# Patient Record
Sex: Female | Born: 1940 | Race: White | Hispanic: No | Marital: Married
Health system: Midwestern US, Community
[De-identification: ages and names within clinical notes are randomized; demographics above are authoritative.]

## PROBLEM LIST (undated history)

## (undated) DIAGNOSIS — Z1211 Encounter for screening for malignant neoplasm of colon: Secondary | ICD-10-CM

---

## 2012-01-13 NOTE — Telephone Encounter (Signed)
01/13/12 Going through NOV 2012 recall list; Pt never sch cln; Called pt to set up ov prior to sch proc and she stated she was in the car and did not have her calendar with her and she has so many upcoming appts and vacation that she will have to call us back to sch; I voiced understanding; I am going to mail out final cln recall letter as a reminder to the pt to sch; [place]

## 2012-06-20 NOTE — Progress Notes (Signed)
Subjective:      Patient ID: Bailey Johnson is a 71 y.o. female.    HPI  Patient seen for colonoscopy recall. Last c-scope 06/2008 negative. Previous history of adenomatous polyp removed in 2005 and hyperplastic polyp removed 2007.   Father with colon cancer diagnosed age 41's. History of colon polyps also.   Patient denies rectal bleeding, change in bowel habit or abdominal pain. Has chronic diarrhea/loose stool.  Reports no anemia, unexplained weight loss.   No ugi complaints.     History:  family history includes Colon Cancer in her father; Colon Polyps in her father; and Uterine Cancer in her mother.  Past Medical History   Diagnosis Date   . Glucose intolerance (impaired glucose tolerance)    . Colitis    . Colon polyp    . Hypothyroidism      Past Surgical History   Procedure Laterality Date   . Hysterectomy     . Blepharoplasty     . Bladder repair     . Cataract removal     . Tubal ligation     . Cholecystectomy     . Breast surgery       [place] RIGHT BREAST   . Colonoscopy  06-17-2008     [place]   . Upper gastrointestinal endoscopy  1973     [place]      reports that she has never smoked. She does not have any smokeless tobacco history on file. She reports that she does not drink alcohol or use illicit drugs.    Allergies:  Allergies   Allergen Reactions   . Latex        Medications:  Current Outpatient Prescriptions on File Prior to Visit   Medication Sig Dispense Refill   . Levothyroxine Sodium (SYNTHROID PO) Take  by mouth.       . Multiple Vitamins-Minerals (MULTIVITAMIN PO) Take  by mouth.         No current facility-administered medications on file prior to visit.         Review of Systems   Constitutional: Negative for fever, appetite change, fatigue and unexpected weight change.   HENT: Negative for sore throat, trouble swallowing and voice change.    Eyes: Negative.    Respiratory: Negative for cough and shortness of breath.    Cardiovascular: Negative for chest pain, palpitations and leg  swelling.   Gastrointestinal: Positive for diarrhea. Negative for nausea, vomiting, abdominal pain, constipation and blood in stool.   Genitourinary: Negative for dysuria, hematuria and difficulty urinating.   Musculoskeletal: Positive for arthralgias. Negative for back pain.   Skin: Negative for color change, pallor and rash.   Neurological: Negative for tremors, seizures and weakness.   Psychiatric/Behavioral: Negative for confusion and sleep disturbance. The patient is not nervous/anxious.        Objective:   Physical Exam   Vitals reviewed.  Constitutional: She is oriented to person, place, and time. She appears well-developed and well-nourished. She is cooperative.   Cardiovascular: Normal rate and regular rhythm.    Pulmonary/Chest: Effort normal and breath sounds normal. No respiratory distress.   Abdominal: Soft. Normal appearance and bowel sounds are normal. She exhibits no distension and no mass. There is no hepatomegaly. There is no tenderness. There is no rebound and no guarding.   Neurological: She is alert and oriented to person, place, and time.   Skin: Skin is warm and dry.   Psychiatric: She has a normal mood and affect. Her  behavior is normal.       Assessment:      Screening colonoscopy  History of adenomatous colon polyp   Family history (father) with colon cancer / polyps      Plan:      Schedule colonoscopy with MAC anesthesia with Dr. Beckey Downing    Instruct on bowel prep.   Nothing to eat or drink after midnight the day of the exam.  Unable to drive for 24 hours after the procedure.   No aspirin or nonsteroidal anti-inflammatories for 5 days before procedure.    Risks, benefits and alternatives to colonoscopy were discussed.  Patient voices understanding of risk of perforation, bleeding and infection.  Time was allowed for questions which were answered to patient's satisfaction. Patient is agreeable to proceed.

## 2012-06-20 NOTE — Patient Instructions (Addendum)
 Schedule colonoscopy  Follow prep instructions provided for bowel prep.   Nothing to eat or drink after midnight.  You will not be able to drive for 24 hours after the procedure due to sedation. Bring a driver with you the day of the procedure.   No aspirin, ibuprofen, naproxen, fish oil or vitamin E for 5 days before procedure.     If polyps are removed during the procedure they will be sent to a pathologist for analysis. You may be given an appointment to discuss this report and check if you have had any problems related to your procedure. Final recommendations are based on the pathologist report.     Your diet before a colonoscopy bowel preparation is very important to ensure a successful colon exam. It is essential to consider certain changes to your diet three to four days prior to the procedure. There are certain foods that you can and cannot eat as part of your diet. Remember that your bowels need to be empty for the exam.    What foods are good to eat?  Cut down on heavy solid foods three to four days before the procedure and start introducing lighter meals to your diet. The following food suggestions are a good part of your diet before a colonoscopy bowel preparation.  Light meat that is easily digestible such as chicken (without the skin)   Potatoes without skin   Cheese   Eggs   A light meal of steamed white fish   Light clear soups    Foods and drinks to avoid  Avoid foods that contain too much fiber. Stay clear of dark colored beverages. They can stick to the walls of the digestive tract and make it difficult to differentiate from blood. Some of these foods are:  Red meat, rice, nuts and vegetables   Milk, other milk based fluids and cream   Most fruit and puddings   Whole grain pasta   Cereals, bran and seeds   Colored beverages, especially those that are red or purple in color   Red colored Jell-O   On the day before the colonoscopy, continue to drink plenty of clear fluids. It is important   to keep  yourself hydrated before the exam.     You will be given specific directions regarding restrictions to diet and bowel prep instructions including laxatives. Please read these instructions one week prior to your scheduled procedure to ensure that you are prepared.     Please follow all instructions as provided for cleansing the bowel. Failure to have an adequately prepped colon may cause you to have incomplete exam with further testing required.     [email or web address]

## 2019-01-23 LAB — HEMOGLOBIN A1C: Hemoglobin A1C, External: 5.9 % — ABNORMAL HIGH (ref 4.80–5.60)

## 2019-09-26 ENCOUNTER — Ambulatory Visit: Admit: 2019-09-26 | Discharge: 2019-09-26 | Payer: MEDICARE | Primary: Internal Medicine

## 2019-10-17 ENCOUNTER — Ambulatory Visit: Admit: 2019-10-17 | Discharge: 2019-10-17 | Payer: MEDICARE | Primary: Internal Medicine

## 2020-02-07 ENCOUNTER — Ambulatory Visit: Admit: 2020-02-07 | Payer: MEDICARE | Primary: Internal Medicine

## 2020-02-07 ENCOUNTER — Inpatient Hospital Stay: Payer: MEDICARE

## 2020-02-07 MED ORDER — PROPOFOL 200 MG/20ML IV EMUL
200 MG/20ML | INTRAVENOUS | Status: DC | PRN
Start: 2020-02-07 — End: 2020-02-07
  Administered 2020-02-07: 15:00:00 200 via INTRAVENOUS

## 2020-02-07 MED ORDER — SODIUM CHLORIDE 0.9 % IV SOLN
0.9 % | INTRAVENOUS | Status: DC
Start: 2020-02-07 — End: 2020-02-07
  Administered 2020-02-07: 14:00:00 via INTRAVENOUS

## 2020-02-07 MED ORDER — LIDOCAINE HCL 1 % IJ SOLN
1 % | INTRAMUSCULAR | Status: DC | PRN
Start: 2020-02-07 — End: 2020-02-07
  Administered 2020-02-07: 15:00:00 40 via INTRAVENOUS

## 2020-02-07 NOTE — Op Note (Signed)
Patient: Bailey Johnson Dob: 1940/08/20  Med Rec#: [id number] Acc#: [id number]   Primary Care Provider [facility], DO    Date of Procedure:  02/07/2020    Endoscopist: Farrel Gobble, MD    Referring Provider: Heywood Footman, DO    Operation Performed: Colonoscopy     Indications: Screening- hx of polyps    Anesthesia:  Sedation was administered by anesthesia who monitored the patient during the procedure.    I met with [name] prior to procedure. We discussed the procedure itself, and I have discussed the risks of endoscopy (including-- but not limited to-- pain, discomfort, bleeding potentially requiring second endoscopic procedure and/or blood transfusion, organ perforation requiring operative repair, damage to organs near the colon, infection, aspiration, cardiopulmonary/allergic reaction), benefits, indications to endoscopy. Additionally, we discussed options other than colonoscopy. The patient expressed understanding. All questions answered. The patient decided to proceed with the procedure.  Signed informed consent was placed on the chart.    Blood Loss: minimal    Withdrawal time: > 6 min  Bowel Prep: adequate     Complications: no immediate complications    DESCRIPTION OF PROCEDURE:     A time out was performed. After written informed consent was obtained, the patient was placed in the left lateral position.     The perianal area was inspected, and a digital rectal exam was performed. A rectal exam was performed: normal tone, no palpable lesions. At this point, a forward viewing Olympus colonoscope was inserted into the anus and carefully advanced to the cecum.  The cecum was identified by the ileocecal valve and the appendiceal orifice. The colonoscope was then slowly withdrawn with careful inspection of the mucosa in a linear and circumferential fashion. The scope was retroflexed in the rectum. Suction was utilized during the procedure to remove as much air as possible from the bowel. The  colonoscope was removed from the patient, and the procedure was terminated.  Findings are listed below.        Findings:     The mucosa appeared normal throughout the entire examined colon.    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     Retroflexion in the rectum was normal and revealed no further abnormalities.      Recommendations:  1. Repeat colonoscopy: not necessary due to age and lack of polyps      Findings and recommendations were discussed w/ the patient. A copy of the images was provided.    Carmell Austria, am scribing for and in the presence of Dr. Farrel Gobble, MD.  Electronically signed by Katherina Right, RN on 02/07/2020 at 9:30 AM    I personally performed the services described in this documentation as scribed by Kaylyn Layer, and it appears accurate and complete.     Farrel Gobble, MD  02/07/2020

## 2020-02-07 NOTE — Discharge Instructions (Signed)
Recommendations:  1. Repeat colonoscopy: not necessary due to age and lack of polyps          POST-OP ORDERS: ENDOSCOPY & COLONOSCOPY:    1. Rest today.  2. DO NOT eat or drink until wide awake; eat your usual diet today in moderate amount only.  3. DO NOT drive today.  4. Call physician if you have severe pain, vomiting, fever, rectal bleeding or black bowel movements.  5.  If a biopsy was taken or a polyp removed, you should expect to hear results in about 21 days.  If you have heard nothing from your physician by then, call the office for results.  6.  Discharge home when patient awake, vitals signs stable and tolerating liquids.

## 2020-02-07 NOTE — H&P (Signed)
Patient Name: Bailey Johnson  DOB: Apr 30, 1941  MRN: [id number]  DATE: 02/07/20    Allergies:   Allergies   Allergen Reactions   . Latex         ENDOSCOPY  History and Physical    Procedure:    []  Diagnostic Colonoscopy       [x]  Screening Colonoscopy  []  EGD      []  ERCP      []  EUS       []  Other    [x]  Previous office notes/History and Physical reviewed from the patients chart. Please see EMR for further details of HPI. I have examined the patient's status immediately prior to the procedure and:      Indications/HPI:       [x]  Screening              [x]  History of Polyps      [] Fhx of colon CA/polyps       Anesthesia:   [x]  MAC []  Moderate Sedation   []  General   []  None     ROS: 12 pt review of systems was negative unless stated above    Medications:   Prior to Admission medications    Medication Sig Start Date End Date Taking? Authorizing Provider   Levothyroxine Sodium (SYNTHROID PO) Take  by mouth.    Historical Provider, MD   Multiple Vitamins-Minerals (MULTIVITAMIN PO) Take  by mouth.    Historical Provider, MD       Past Medical History:  Past Medical History:   Diagnosis Date   . Colitis    . Colon polyp    . Eczema    . Glucose intolerance (impaired glucose tolerance)    . Hypothyroidism    . OA (osteoarthritis) of knee        Past Surgical History:  Past Surgical History:   Procedure Laterality Date   . BLADDER REPAIR     . BLEPHAROPLASTY     . BREAST LUMPECTOMY      right breast- benign   . BREAST SURGERY      CYST RIGHT BREAST   . CATARACT REMOVAL     . CHOLECYSTECTOMY     . COLONOSCOPY  06-17-2008    [place]   . FOOT SURGERY      right   . HYSTERECTOMY     . TUBAL LIGATION     . UPPER GASTROINTESTINAL ENDOSCOPY  1973    [place]       Social History:  Social History     Tobacco Use   . Smoking status: Never Smoker   Substance Use Topics   . Alcohol use: No   . Drug use: No       Vital Signs:   There were no vitals filed for this visit.     Physical  Exam:  Cardiac:  [x] WNL  [] Comments:  Pulmonary:  [x] WNL   [] Comments:  Neuro/Mental Status:  [x] WNL  [] Comments:  Abdominal:  [x] WNL    [] Comments:  Other:   [] WNL  [] Comments:    Informed Consent:  The risks and benefits of the procedure have been discussed with either the patient or if they cannot consent, their representative.    Assessment:  Patient examined and appropriate for planned sedation and procedure.     Plan:  Proceed with planned sedation and procedure as above.    Carmell Austria, am scribing for and in the presence of Dr. Farrel Gobble, MD.  Electronically signed by  Katherina Right, RN on 02/07/2020 at 9:29 AM    I personally performed the services described in this documentation as scribed by Kaylyn Layer, and it appears accurate and complete.     Farrel Gobble, MD  02/07/2020

## 2020-02-07 NOTE — Anesthesia Post-Procedure Evaluation (Signed)
Department of Anesthesiology  Postprocedure Note    Patient: Bailey Johnson  MRN: [id number]  Birthdate: 1940-09-05  Date of evaluation: 02/07/2020  Time:  11:02 AM     Procedure Summary     Date: 02/07/20 Room / Location: [facility] ENDO 02 / [facility]    Anesthesia Start: 1055 Anesthesia Stop:     Procedure: COLORECTAL CANCER SCREENING, NOT HIGH RISK (N/A Abdomen) Diagnosis: (FAM HX COLON CA)    Surgeons: [place], MD Responsible Provider: Nickolas Madrid, APRN - CRNA    Anesthesia Type: general, TIVA ASA Status: 2          Anesthesia Type: No value filed.    Aldrete Phase I:      Aldrete Phase II:      Last vitals: Reviewed and per EMR flowsheets.       Anesthesia Post Evaluation    Patient location during evaluation: bedside  Patient participation: complete - patient participated  Level of consciousness: sleepy but conscious  Pain score: 0  Airway patency: patent  Nausea & Vomiting: no nausea and no vomiting  Complications: no  Cardiovascular status: blood pressure returned to baseline  Respiratory status: acceptable, room air and spontaneous ventilation  Hydration status: euvolemic

## 2020-02-07 NOTE — Anesthesia Pre-Procedure Evaluation (Signed)
Department of Anesthesiology  Preprocedure Note       Name:  CHARRIE EIKEN   Age:  79 y.o.  DOB:  [birth year]                                          MRN:  [id number]         Date:  02/07/2020      Surgeon: Moishe Spice):  [place], MD    Procedure: Procedure(s):  COLORECTAL CANCER SCREENING, NOT HIGH RISK    Medications prior to admission:   Prior to Admission medications    Medication Sig Start Date End Date Taking? Authorizing Provider   meloxicam (MOBIC) 7.5 MG tablet Take 7.5 mg by mouth daily   Yes Historical Provider, MD   omeprazole (PRILOSEC) 20 MG delayed release capsule Take 40 mg by mouth daily   Yes Historical Provider, MD   Levothyroxine Sodium (SYNTHROID PO) Take  by mouth.   Yes Historical Provider, MD   Multiple Vitamins-Minerals (MULTIVITAMIN PO) Take  by mouth.   Yes Historical Provider, MD       Current medications:    Current Facility-Administered Medications   Medication Dose Route Frequency Provider Last Rate Last Admin   . 0.9 % sodium chloride infusion   Intravenous Continuous [place], MD 100 mL/hr at 02/07/20 0955 New Bag at 02/07/20 0955       Allergies:    Allergies   Allergen Reactions   . Latex        Problem List:    Patient Active Problem List   Diagnosis Code   . History of adenomatous polyp of colon Z86.010   . Family history of colon cancer Z80.0   . Family history of colonic polyps Z83.71       Past Medical History:        Diagnosis Date   . Colitis    . Colon polyp    . Eczema    . Glucose intolerance (impaired glucose tolerance)    . Hypothyroidism    . OA (osteoarthritis) of knee        Past Surgical History:        Procedure Laterality Date   . BLADDER REPAIR     . BLEPHAROPLASTY     . BREAST LUMPECTOMY      right breast- benign   . BREAST SURGERY      CYST RIGHT BREAST   . CATARACT REMOVAL     . CHOLECYSTECTOMY     . [place]ONOSCOPY  06-17-2008    [place]   . FOOT SURGERY      right   . HYSTERECTOMY     . TUBAL LIGATION     . UPPER GASTROINTESTINAL ENDOSCOPY  1973     [place]       Social History:    Social History     Tobacco Use   . Smoking status: Never Smoker   Substance Use Topics   . Alcohol use: No                                Counseling given: Not Answered      Vital Signs (Current):   Vitals:    02/07/20 0931   BP: (!) 119/58   Pulse: 80   Resp: 17   Temp: 97.2  F (36.2 C)   TempSrc: Tympanic   SpO2: 99%   Weight: 135 lb (61.2 kg)   Height: 5\' 4"  (1.626 m)                                              BP Readings from Last 3 Encounters:   02/07/20 (!) 119/58   06/20/12 120/74       NPO Status: Time of last liquid consumption: 0615                        Time of last solid consumption: 1430                        Date of last liquid consumption: 02/07/20                        Date of last solid food consumption: 02/05/20    BMI:   Wt Readings from Last 3 Encounters:   02/07/20 135 lb (61.2 kg)   06/20/12 136 lb 11.2 oz (62 kg)     Body mass index is 23.17 kg/m.    CBC: No results found for: WBC, RBC, HGB, HCT, MCV, RDW, PLT    CMP: No results found for: NA, K, CL, CO2, BUN, CREATININE, GFRAA, AGRATIO, LABGLOM, GLUCOSE, PROT, CALCIUM, BILITOT, ALKPHOS, AST, ALT    POC Tests: No results for input(s): POCGLU, POCNA, POCK, POCCL, POCBUN, POCHEMO, POCHCT in the last 72 hours.    Coags: No results found for: PROTIME, INR, APTT    HCG (If Applicable): No results found for: PREGTESTUR, PREGSERUM, HCG, HCGQUANT     ABGs: No results found for: PHART, PO2ART, PCO2ART, HCO3ART, BEART, O2SATART     Type & Screen (If Applicable):  No results found for: LABABO, LABRH    Drug/Infectious Status (If Applicable):  No results found for: HIV, HEPCAB    COVID-19 Screening (If Applicable): No results found for: COVID19        Anesthesia Evaluation  Patient summary reviewed and Nursing notes reviewed  Airway: Mallampati: II  TM distance: >3 FB   Neck ROM: full  Mouth opening: > = 3 FB Dental: normal exam         Pulmonary:Negative Pulmonary ROS and normal exam                                Cardiovascular:Negative CV ROS             Beta Blocker:  Not on Beta Blocker         Neuro/Psych:   Negative Neuro/Psych ROS              GI/Hepatic/Renal: Neg GI/Hepatic/Renal ROS            Endo/Other:    (+) hypothyroidism::., .                 Abdominal:           Vascular: negative vascular ROS.                                       Anesthesia Plan  general and TIVA     ASA 2       Induction: intravenous.      Anesthetic plan and risks discussed with patient.      Plan discussed with CRNA.                  Nickolas Madrid, APRN - CRNA   02/07/2020

## 2020-12-02 NOTE — Progress Notes (Signed)
Formatting of this note is different from the original.  12/02/2020     Bailey Samples, DO  [address]  [facility] [place] [address]    AMORETTE CHARRETTE  12/23/1940    Chief Complaint   Patient presents with   ? Chronic Venous Hypertension      6 month follow-up for Chronic Venous Hypertension - bilateral. Patient does not wear compression but does elevate legs when sitting.  Pt states she has swelling in both ankles.  Pt denies any stroke-luke symptoms.   ? Never Smoker     Pt verified Never Smoker      Dear Bailey Samples, DO     HPI  I had the pleasure of seeing your patient Bailey Johnson in the office today.  As you recall, Bailey Johnson is a 80 y.o.  female who we are following for venous insufficiency.  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  She was complaining of heaviness, tiredness, aching, and cramping at night.  She does wear compression stockings on a daily basis.  She underwent radiofrequency ablation of the right lower extremity on 04/25/2020.  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  She was on Xarelto for 1 month.  She reports her legs are feeling good.      Review of Systems   Constitutional: Negative.    HENT: Negative.    Eyes: Negative.    Respiratory: Negative.    Cardiovascular: Negative for leg swelling.        Cramping   Gastrointestinal: Negative.    Endocrine: Negative.    Genitourinary: Negative.    Musculoskeletal: Negative.    Skin: Negative.    Allergic/Immunologic: Negative.    Neurological: Positive for numbness (left foot).   Hematological: Negative.    Psychiatric/Behavioral: Negative.    All other systems reviewed and are negative.      BP 136/60 (BP Location: Left arm, Patient Position: Sitting, Cuff Size: Adult)   Pulse 75   Resp 18   Ht 162.6 cm (64")   Wt 64.4 kg (142 lb)   SpO2 100%   BMI 24.37 kg/m   Physical Exam  Vitals and nursing note reviewed.    Constitutional:       General: She is not in acute distress.     Appearance: She is well-developed. She is not diaphoretic.   HENT:      Head: Normocephalic and atraumatic.   Eyes:      General: No scleral icterus.     Pupils: Pupils are equal, round, and reactive to light.   Neck:      Thyroid: No thyromegaly.      Vascular: No carotid bruit or JVD.   Cardiovascular:      Rate and Rhythm: Normal rate and regular rhythm.      Pulses: Normal pulses.      Heart sounds: Normal heart sounds and S2 normal. No murmur heard.    No friction rub. No gallop.      Comments: Varicose veins to bilateral lower extremities, worse to her thighs  Pulmonary:      Effort: Pulmonary effort is normal.      Breath sounds: Normal breath sounds.   Abdominal:      General: Bowel sounds are normal.      Palpations: Abdomen is soft.   Musculoskeletal:  General: Normal range of motion.      Cervical back: Normal range of motion and neck supple.      Right lower leg: Edema present.      Left lower leg: Edema present.   Skin:     General: Skin is warm and dry.   Neurological:      Mental Status: She is alert and oriented to person, place, and time.      Cranial Nerves: No cranial nerve deficit.   Psychiatric:         Behavior: Behavior normal.         Thought Content: Thought content normal.         Judgment: Judgment normal.       No results found.     Patient Active Problem List   Diagnosis   ? Actinic keratosis of left cheek   ? Allergic rhinitis, mild   ? At low risk for fall   ? Chronic diarrhea   ? Cough   ? Eczema   ? Encounter for long-term (current) drug use   ? Family history of colon cancer   ? Gastroesophageal reflux disease without esophagitis   ? History of adenomatous polyp of colon   ? Hypothyroidism (acquired)   ? IFG (impaired fasting glucose)   ? Negative depression screening   ? Neuropathy of left foot   ? Primary osteoarthritis of both knees   ? Varicose veins of legs   ? 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 Chronic venous hypertension with inflammation involving both sides  I87.323 459.32   2. Asymptomatic varicose veins of both lower extremities  I83.93 454.9     Plan: After thoroughly evaluating Bailey Johnson, I believe the best course of action is to remain conservative from vascular surgery standpoint.  Currently, she appears to be doing quite well and has no problems in the right lower extremity.  She does have some mild swelling still present bilaterally but this does not bother her.  I will see her back in 1 years time for continued evaluation.  If it anytime, her left leg begins to bother her I instructed her to call me and we can treat it appropriately.  I would like her to continue wearing compression stockings on a daily basis, elevate her legs when she is not on them, and keep them well moisturized.   The patient can continue taking their current medication regimen as previously planned.  This was all discussed in full with complete understanding.    Thank you for allowing me to participate in the care of your patient.  Please do not hesitate with any questions or concerns.  I will keep you aware of any further encounters with Bailey Johnson.    Sincerely yours,        Ermelinda Das, DO        Electronically signed by Ermelinda Das, DO at 12/02/2020  2:13 PM EDT

## 2023-01-20 ENCOUNTER — Ambulatory Visit: Admit: 2023-01-20 | Discharge: 2023-01-20 | Payer: MEDICARE | Primary: Internal Medicine

## 2023-01-20 DIAGNOSIS — J069 Acute upper respiratory infection, unspecified: Secondary | ICD-10-CM

## 2023-01-20 LAB — RESPIRATORY PANEL, MOLECULAR, WITH COVID-19
Adenovirus by PCR: NOT DETECTED
Bordetella parapertussis by PCR: NOT DETECTED
Bordetella pertussis by PCR: NOT DETECTED
Chlamydophilia pneumoniae by PCR: NOT DETECTED
Coronavirus 229E by PCR: NOT DETECTED
Coronavirus HKU1 by PCR: NOT DETECTED
Coronavirus NL63 by PCR: NOT DETECTED
Coronavirus OC43 by PCR: NOT DETECTED
Human Metapneumovirus by PCR: NOT DETECTED
Human Rhinovirus/Enterovirus by PCR: NOT DETECTED
Influenza A by PCR: NOT DETECTED
Influenza B by PCR: NOT DETECTED
Mycoplasma pneumoniae by PCR: NOT DETECTED
Parainfluenza Virus 1 by PCR: NOT DETECTED
Parainfluenza Virus 2 by PCR: NOT DETECTED
Parainfluenza Virus 3 by PCR: NOT DETECTED
Parainfluenza Virus 4 by PCR: NOT DETECTED
Respiratory Syncytial Virus by PCR: NOT DETECTED
SARS-CoV-2, PCR: NOT DETECTED

## 2023-01-20 MED ORDER — LORATADINE 10 MG PO TABS
10 | ORAL_TABLET | Freq: Every day | ORAL | 0 refills | Status: AC
Start: 2023-01-20 — End: 2023-02-19

## 2023-01-20 MED ORDER — TOBRAMYCIN 0.3 % OP SOLN
0.3 | OPHTHALMIC | 0 refills | Status: AC
Start: 2023-01-20 — End: 2023-01-27

## 2023-01-20 NOTE — Addendum Note (Signed)
Addended by: Shanna Cisco on: 01/20/2023 01:48 PM     Modules accepted: Orders

## 2023-01-20 NOTE — Patient Instructions (Addendum)
-   Use Tobrex as prescribed  - Recommended warm, moist compresses three to five times per day   - Wash hands frequently and avoid touching the eyes  - If applicable, discontinue eye makeup to support healing.  - Respiratory panel ordered, will contact with results   - The patient is to follow up with PCP or return to clinic if symptoms worsen/fail to improve.     Urgent Care evaluation today is not a substitute for PCP visit. Follow up care is your responsibility to discuss and review this UCC visit.

## 2023-01-20 NOTE — Progress Notes (Signed)
[facility]  [facility]  [address]  [facility] [place] [address]  Dept: [phone]  Dept Fax: [phone]  Loc: [phone]    Bailey Johnson is a 82 y.o. female who presents today for her medical conditions/complaints as noted below.  Bailey Johnson is complaining of Eye Drainage and Sore Throat        HPI:   Patient here for left eye redness, itching, and drainage since this morning. Admits small amount of drainage and gritty feeling to the eye. Denies any injury or contact with foreign object. She is also having sore throat, dry cough, and fatigue as well. No known sick contacts or exposure. Husband is also having the same symptoms.    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)    FOOT SURGERY Right     HYSTERECTOMY (CERVIX STATUS UNKNOWN)      TUBAL LIGATION      UPPER GASTROINTESTINAL ENDOSCOPY  1973    [place]       Family History   Problem Relation Age of Onset    Uterine Cancer Mother     Colon Cancer Father     Colon Polyps Father        Social History     Tobacco Use    Smoking status: Never    Smokeless tobacco: Not on file   Substance Use Topics    Alcohol use: No        Current Outpatient Medications   Medication Sig Dispense Refill    esomeprazole (NEXIUM) 40 MG delayed release capsule       halobetasol (ULTRAVATE) 0.05 % ointment Apply topically 2 times daily      triamcinolone (KENALOG) 0.025 % cream 1 (one) Application two times daily, as needed for 30 days       levothyroxine (SYNTHROID) 25 MCG tablet Take by mouth      tobramycin (TOBREX) 0.3 % ophthalmic solution Place 1 drop into the left eye every 4 hours for 7 days 1 each 0    loratadine (CLARITIN) 10 MG tablet Take 1 tablet by mouth daily 30 tablet 0    meloxicam (MOBIC) 7.5 MG tablet Take 1 tablet by mouth daily      omeprazole (PRILOSEC) 20 MG delayed release capsule Take 2 capsules by mouth daily      Levothyroxine Sodium (SYNTHROID PO) Take  by mouth.      Multiple Vitamins-Minerals (MULTIVITAMIN PO) Take  by mouth.       No current facility-administered medications for this visit.       Allergies   Allergen Reactions    Latex        Health Maintenance   Topic Date Due  Depression Screen  Never done    DTaP/Tdap/Td vaccine (1 - Tdap) Never done    Shingles vaccine (1 of 2) Never done    DEXA (modify frequency per FRAX score)  Never done    Respiratory Syncytial Virus (RSV) Pregnant or age 52 yrs+ (1 - 1-dose 60+ series) Never done    COVID-19 Vaccine (5 - 2023-24 season) 04/16/2022    Annual Wellness Visit (Medicare Advantage)  Never done    Flu vaccine (Season Ended) 03/17/2023    Colorectal Cancer Screen  Completed    Pneumococcal 65+ years Vaccine  Completed    Hepatitis A vaccine  Aged Out    Hepatitis B vaccine  Aged Out    Hib vaccine  Aged Out    Polio vaccine  Aged Out    Meningococcal (ACWY) vaccine  Aged Out       Subjective:   Review of Systems   Constitutional:  Positive for fatigue. Negative for activity change, appetite change, chills and fever.   HENT:  Positive for sore throat. Negative for congestion, ear pain, rhinorrhea and sinus pressure.    Eyes:  Positive for discharge, redness and itching. Negative for pain and visual disturbance.   Respiratory:  Negative for cough, shortness of breath and wheezing.    Cardiovascular:  Negative for chest pain.   Gastrointestinal:  Negative for abdominal pain, blood in stool, constipation, diarrhea, nausea and vomiting.   Musculoskeletal:  Negative for  myalgias.   Skin:  Negative for color change and rash.   Neurological:  Negative for dizziness and headaches.   All other systems reviewed and are negative.      Objective    Physical Exam  Vitals and nursing note reviewed.   Constitutional:       General: She is not in acute distress.     Appearance: Normal appearance. She is not ill-appearing.   HENT:      Head: Normocephalic and atraumatic.      Right Ear: Tympanic membrane and ear canal normal.      Left Ear: Tympanic membrane and ear canal normal.      Nose: Rhinorrhea present.      Mouth/Throat:      Mouth: Mucous membranes are moist.      Pharynx: No oropharyngeal exudate or posterior oropharyngeal erythema.   Eyes:      General:         Left eye: Discharge present.     Extraocular Movements: Extraocular movements intact.      Conjunctiva/sclera:      Left eye: Left conjunctiva is injected. No exudate.     Pupils: Pupils are equal, round, and reactive to light.      Comments: Watery drainage from left eye/ Left upper eyelid redness and slight swelling   Cardiovascular:      Rate and Rhythm: Normal rate and regular rhythm.      Pulses: Normal pulses.      Heart sounds: Normal heart sounds. No murmur heard.  Pulmonary:      Effort: Pulmonary effort is normal. No respiratory distress.      Breath sounds: Normal breath sounds. No wheezing, rhonchi or rales.   Skin:     General: Skin is warm.      Capillary Refill: Capillary refill takes less than 2 seconds.      Coloration: Skin is not pale.      Findings: No rash.   Neurological:      General: No focal  deficit present.      Mental Status: She is alert and oriented to person, place, and time.      Deep Tendon Reflexes: Reflexes are normal and symmetric.   Psychiatric:         Attention and Perception: Attention normal.         Mood and Affect: Mood normal.         Behavior: Behavior normal. Behavior is cooperative.         Thought Content: Thought content normal.         BP 124/72   Pulse 93   Temp 98.2 F  (36.8 C)   Resp 20   Wt 61.2 kg (135 lb)   SpO2 97%   BMI 23.17 kg/m     Assessment   Assessment & Plan      Diagnosis Orders   1. Upper respiratory tract infection, unspecified type  loratadine (CLARITIN) 10 MG tablet      2. Bacterial conjunctivitis of left eye  tobramycin (TOBREX) 0.3 % ophthalmic solution      3. Dry cough  loratadine (CLARITIN) 10 MG tablet      4. Other fatigue            Plan   - Use Tobrex as prescribed  - Recommended warm, moist compresses three to five times per day   - Wash hands frequently and avoid touching the eyes  - If applicable, discontinue eye makeup to support healing.  - Respiratory panel ordered, will contact with results   - The patient is to follow up with PCP or return to clinic if symptoms worsen/fail to improve.     Urgent Care evaluation today is not a substitute for PCP visit. Follow up care is your responsibility to discuss and review this UCC visit.   No orders of the defined types were placed in this encounter.      No results found for this visit on 01/20/23.    Orders Placed This Encounter   Medications    tobramycin (TOBREX) 0.3 % ophthalmic solution     Sig: Place 1 drop into the left eye every 4 hours for 7 days     Dispense:  1 each     Refill:  0    loratadine (CLARITIN) 10 MG tablet     Sig: Take 1 tablet by mouth daily     Dispense:  30 tablet     Refill:  0      New Prescriptions    LORATADINE (CLARITIN) 10 MG TABLET    Take 1 tablet by mouth daily    TOBRAMYCIN (TOBREX) 0.3 % OPHTHALMIC SOLUTION    Place 1 drop into the left eye every 4 hours for 7 days        Return if symptoms worsen or fail to improve.         Discussed use, benefits, and side effects of any prescribed medications. All patient questions were answered. Patient voiced understanding of care plan.   Patient was given educational materials - see patient instructions below.     Patient Instructions   - Use Tobrex as prescribed  - Recommended warm, moist compresses three to five times per  day   - Wash hands frequently and avoid touching the eyes  - If applicable, discontinue eye makeup to support healing.  - Respiratory panel ordered, will contact with results   - The patient is to follow up with PCP or return to clinic if  symptoms worsen/fail to improve.     Urgent Care evaluation today is not a substitute for PCP visit. Follow up care is your responsibility to discuss and review this UCC visit.         Electronically signed by Lauretta Grill, APRN - NP on 01/20/2023 at 1:41 PM

## 2023-09-20 IMAGING — DX LUMBAR SPINE COMPLETE WITH FLEX AND EXT 6 VI
1 series · 6 of 6 positions shown · non-contrast
Comparison: None

________________________________________________________________________________________________ 
LUMBAR SPINE COMPLETE WITH FLEX AND EXT 6 VI, 09/20/2023 [DATE]: 
CLINICAL INDICATION: Back pain. Radiculopathy, lumbar region

[Series 1: AP · 0.14mm/px · 6 of 6 slices shown]
[im 1/6]
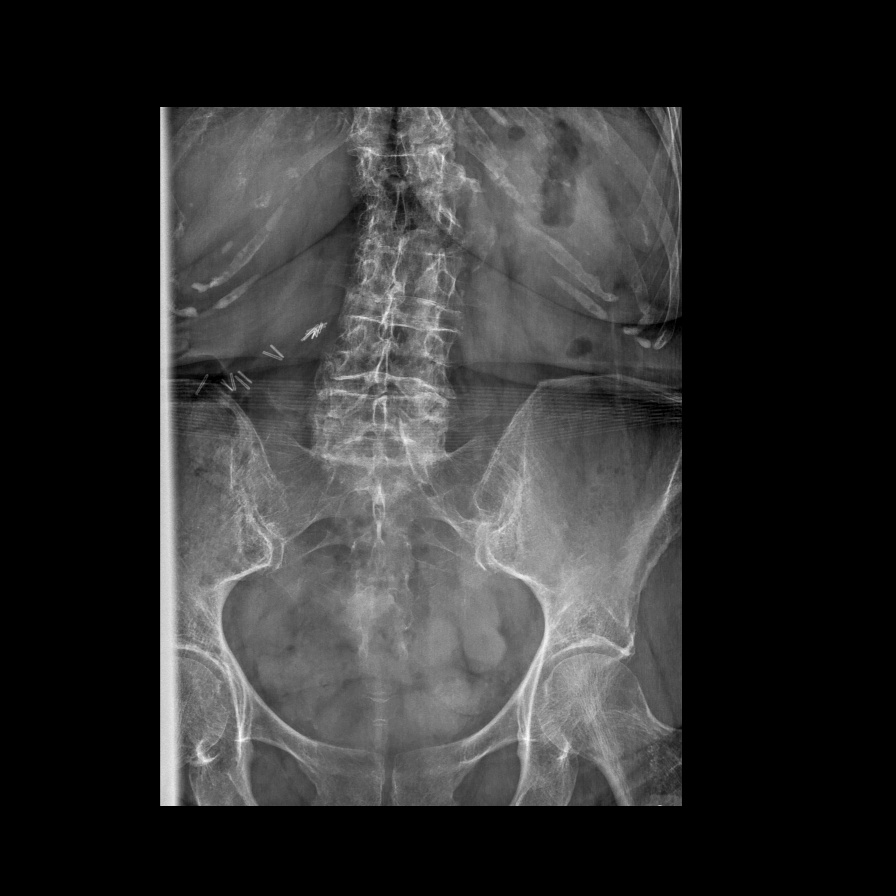
[im 2/6]
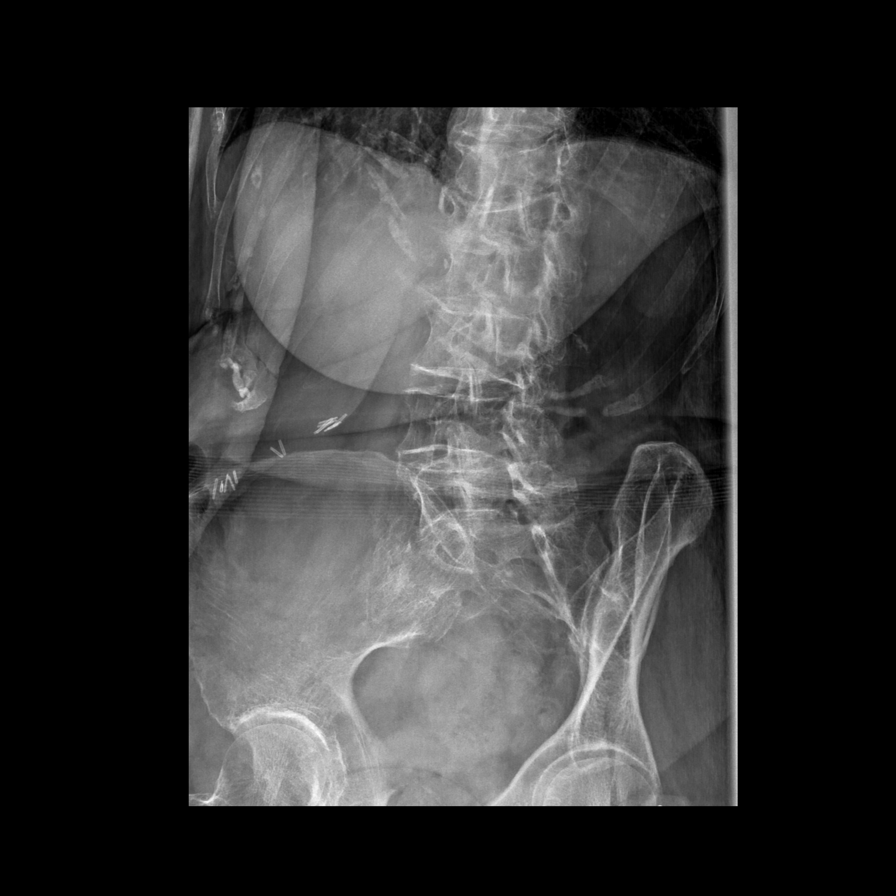
[im 3/6]
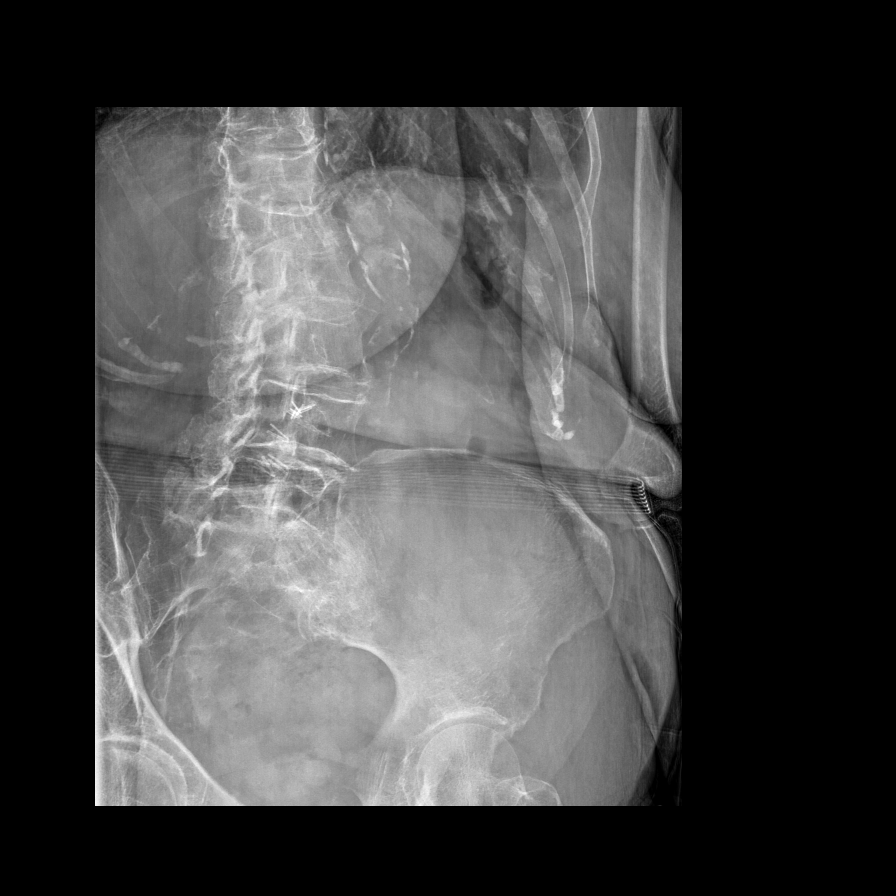
[im 4/6]
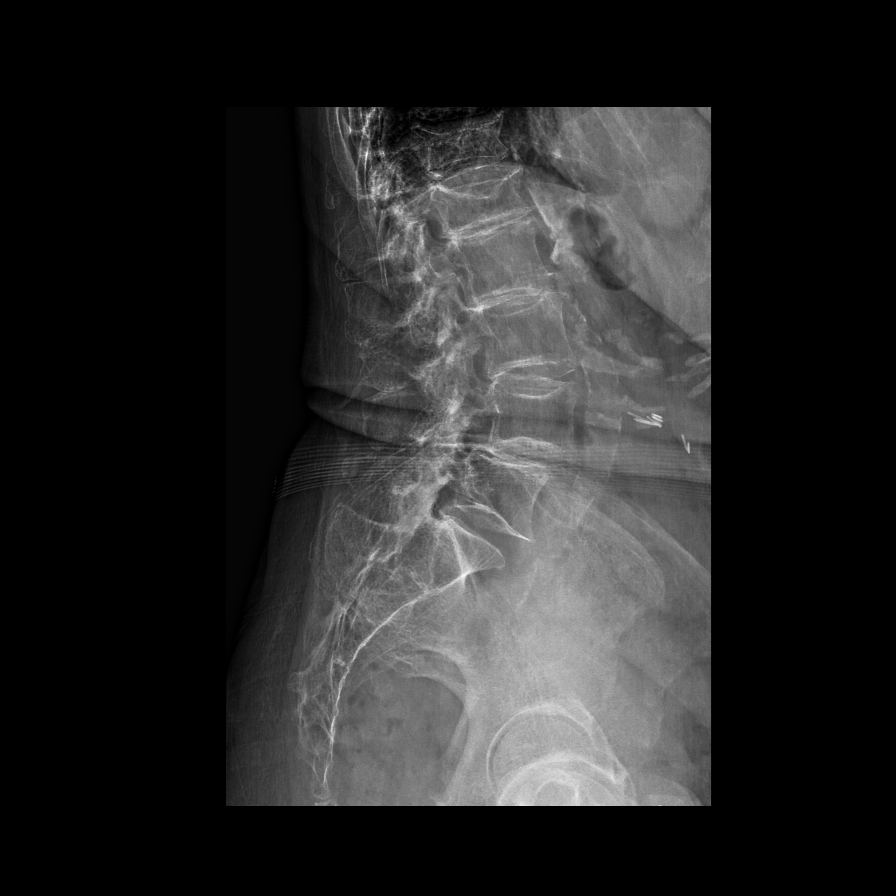
[im 5/6]
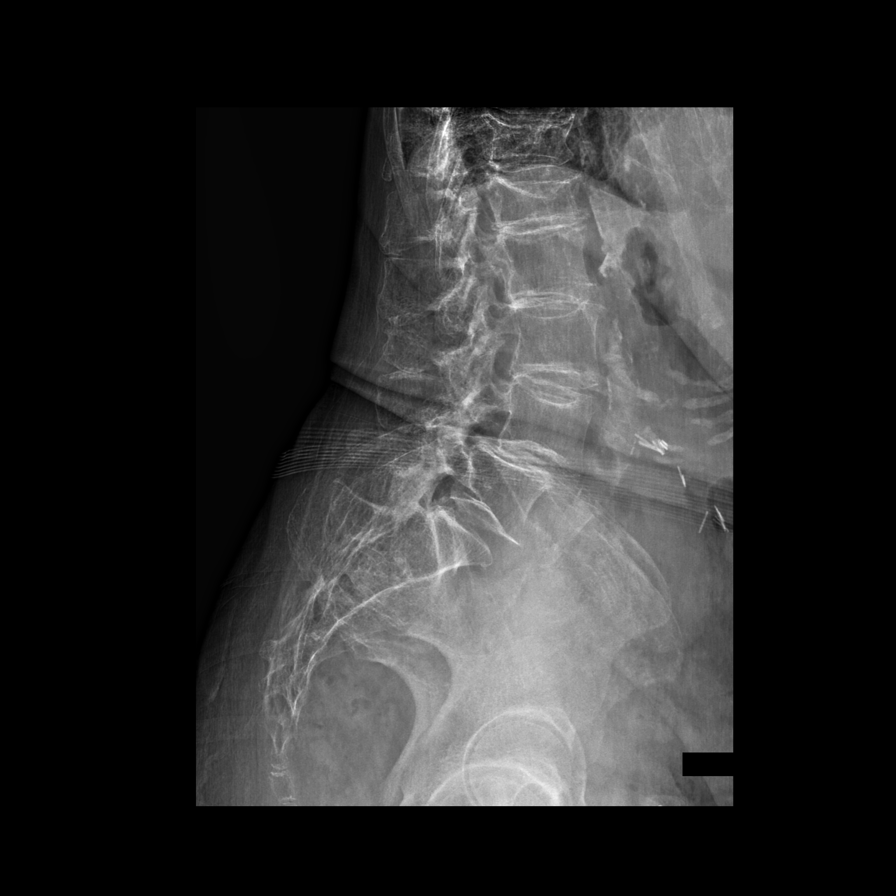
[im 6/6]
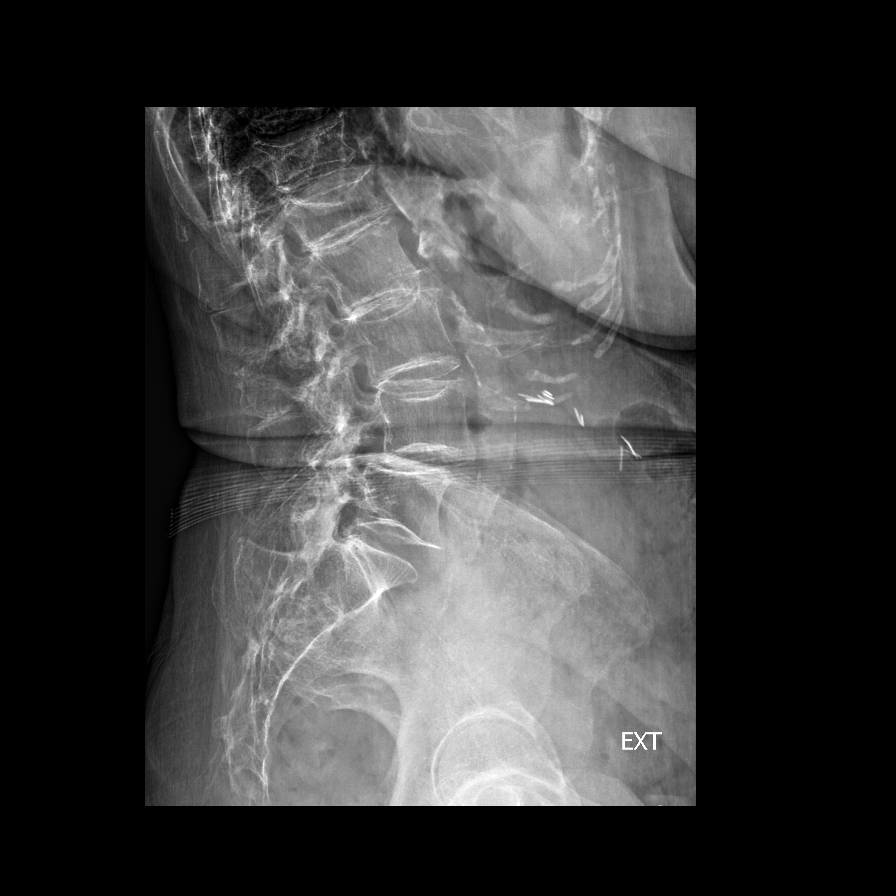

[6 of 6 positions shown; findings below may reference images not displayed]

FINDINGS: Severe T12 and L1 vertebral body superior endplate compression 
fractures are likely chronic. Multifocal degenerative change and grade 1 
anterolisthesis at L4-5. No dynamic instability. Multifocal disc space narrowing 
and marginal osteophytes. Lower lumbar/lumbosacral facet arthropathy. Mild 
levocurve. Degenerative change of the SI joints. Osteopenia. Atherosclerosis. 
Right abdominal clips.
IMPRESSION: 1.  Severe T12 and L1 vertebral body superior endplate compression fractures are 
likely chronic.  
2.  Multifocal degenerative change and grade 1 anterolisthesis at L4-5. No 
dynamic instability. 
3.  Osteopenia: DXA with TBS (trabecular bone score) may be helpful for further 
evaluation.

## 2023-10-06 IMAGING — MR MRI LUMBAR SPINE WITHOUT CONTRAST
6 of 8 series · 12 of 48 positions shown · IV contrast (gadolinium)
Comparison: X-ray from September 20, 2023.

________________________________________________________________________________________________ 
MRI LUMBAR SPINE WITHOUT CONTRAST, 10/06/2023 [DATE]: 
CLINICAL INDICATION: Low back pain that radiates down left leg.
TECHNIQUE: Multiplanar, multiecho position MR images of the lumbar spine were 
performed without intravenous gadolinium enhancement. Patient was scanned on a 
1.5T magnet

[Series 101: survey · axial · 10.0mm · 1.25mm/px · 1 of 10 slices shown]
[im 1/10]
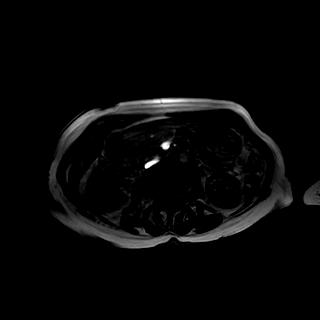

[Series 201: t2w_cor-surv · coronal · 6.0mm · 0.62mm/px · 1 of 14 slices shown]
[im 1/14]
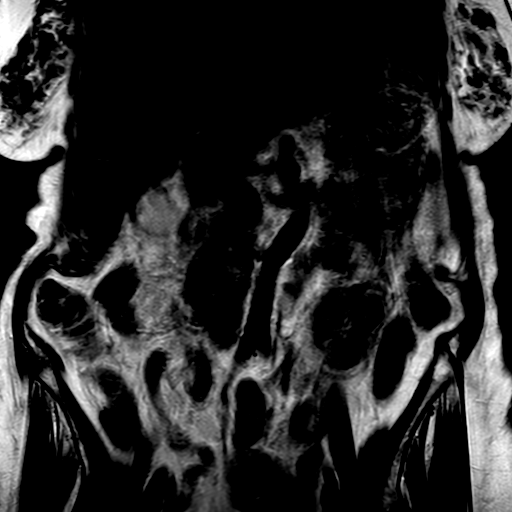

[Series 301: t1_tse_sag · sagittal · 4.0mm · 0.34mm/px · 3 of 19 slices shown]
[im 1/19]
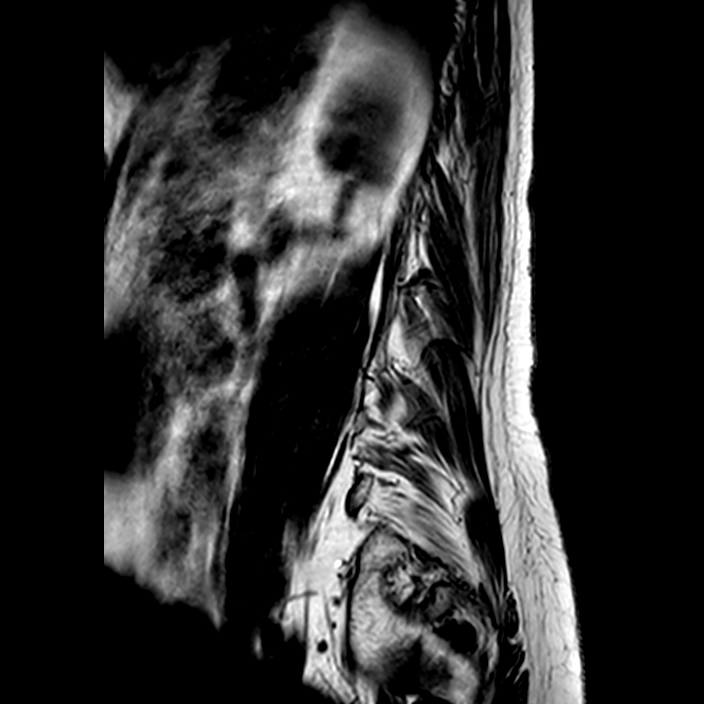
[im 10/19]
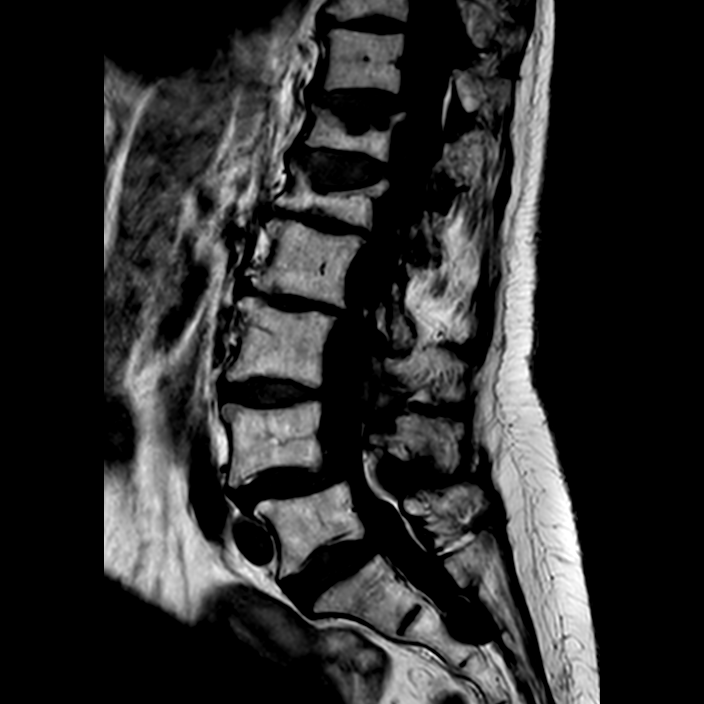
[im 19/19]
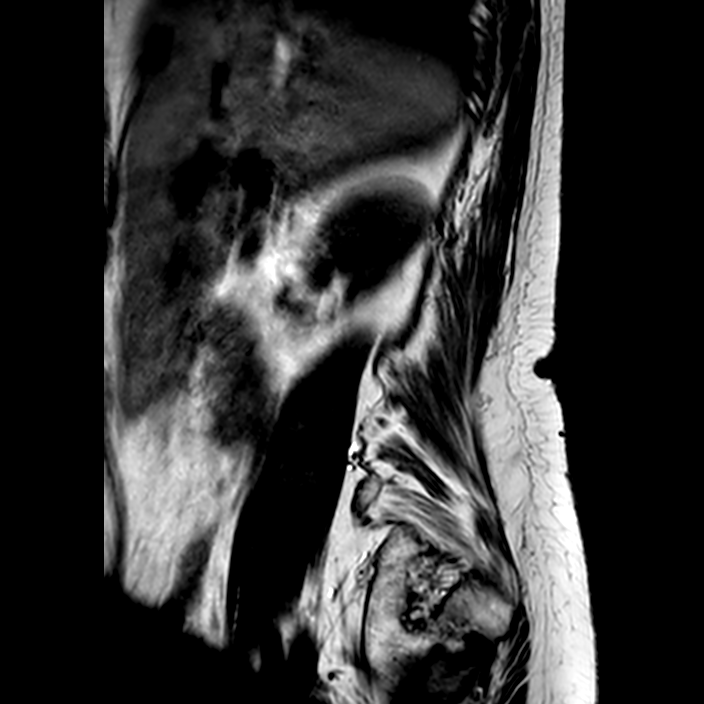

[Series 402: (id)_mdixon_tse · sagittal · 4.0mm · 0.51mm/px · 3 of 19 slices shown]
[im 1/19]
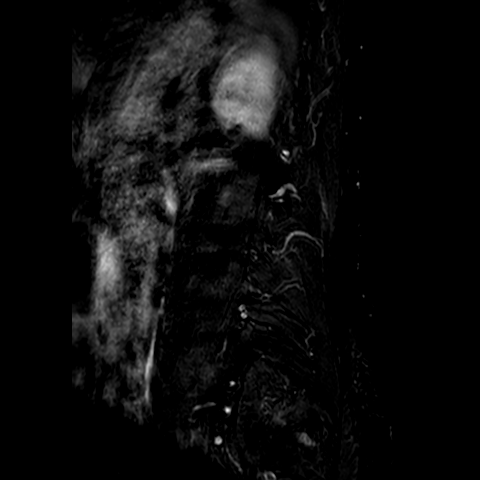
[im 10/19]
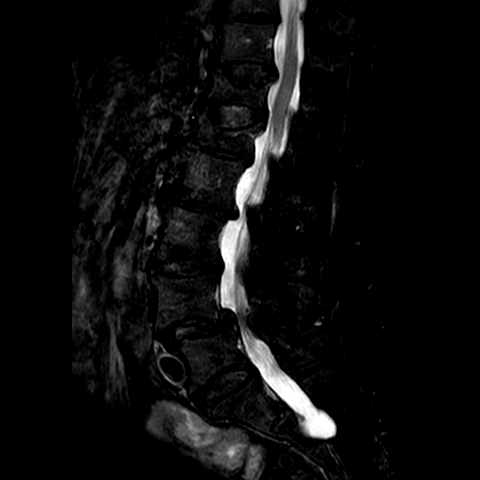
[im 19/19]
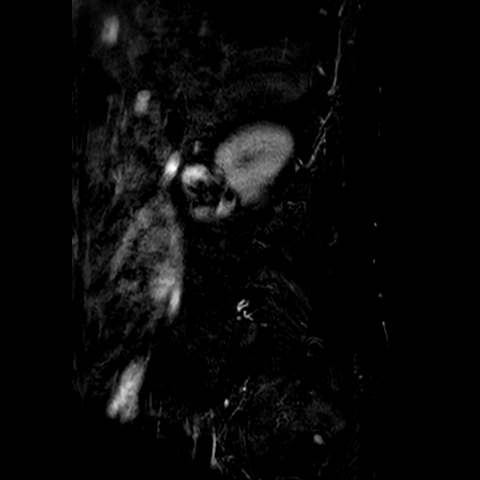

[Series 403: st2w_mdixon_tse · sagittal · 4.0mm · 0.51mm/px · 3 of 19 slices shown]
[im 1/19]
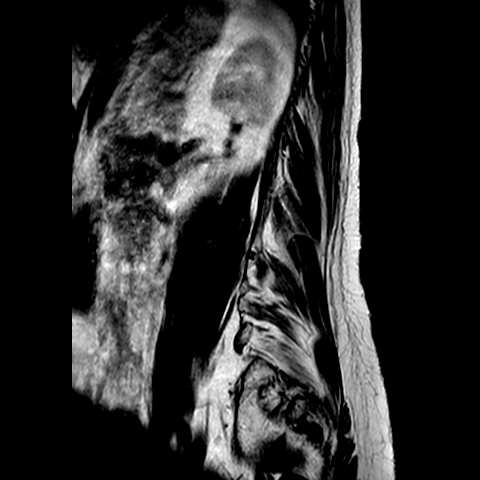
[im 10/19]
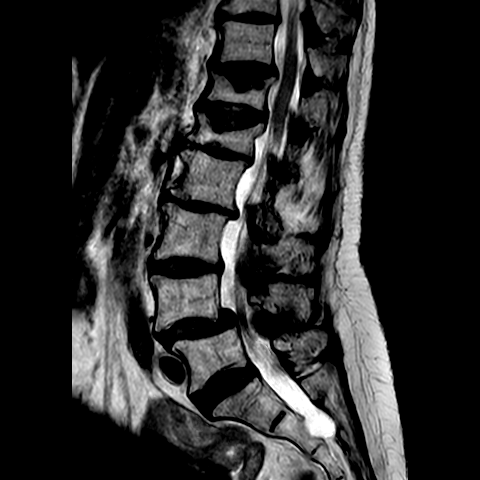
[im 19/19]
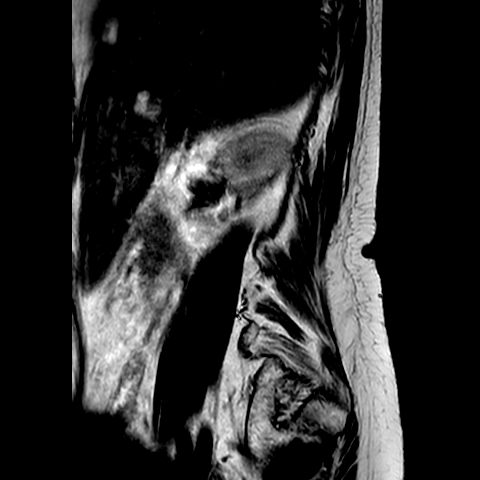

[Series 502: (id) view_ax mpr · axial · 1.0mm · 0.25mm/px · 1 of 119 slices shown]
[im 8/119]
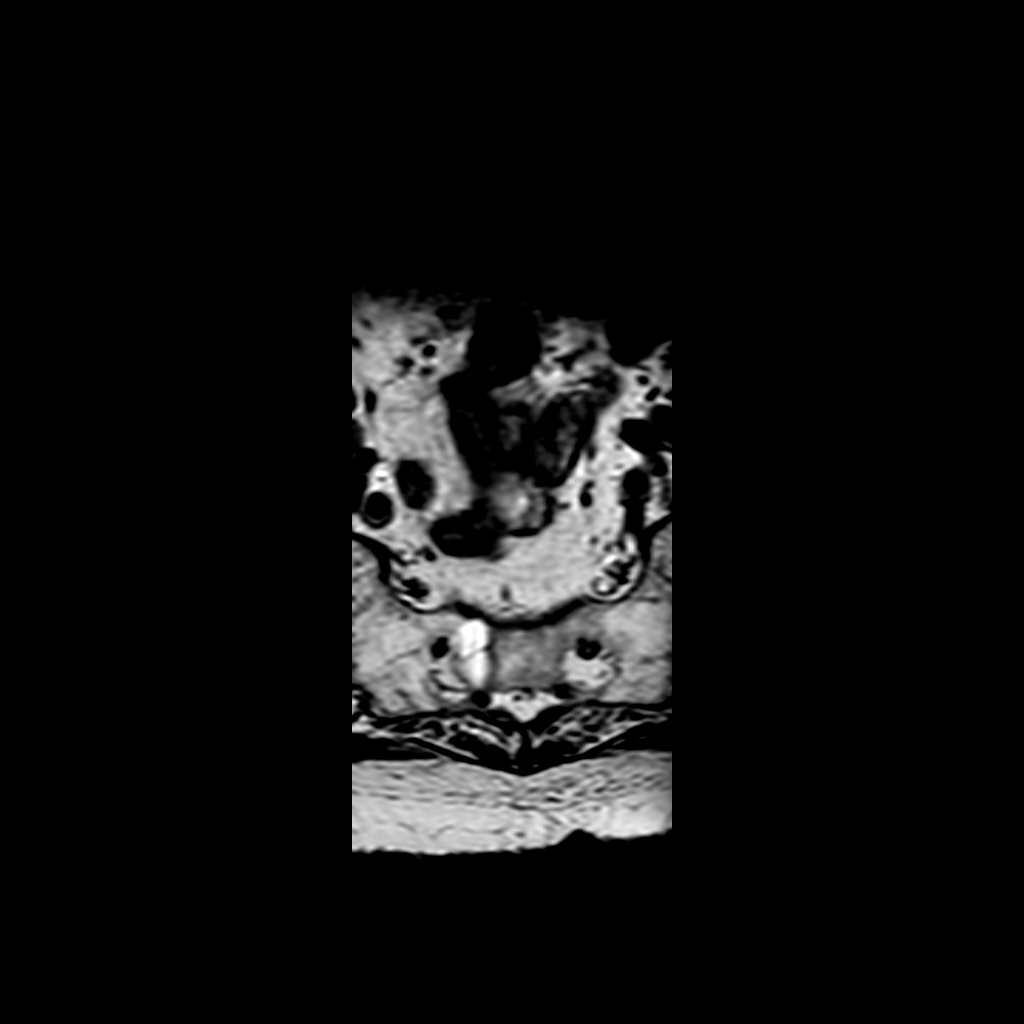

[12 of 48 positions shown; findings below may reference images not displayed]

FINDINGS: -------------------------------------------------------------------------------- 
------ 
GENERAL: 
Nomenclature is based on 5 lumbar type vertebral bodies.     
ALIGNMENT: Leftward curvature of the lumbar spine. Mild retrolisthesis of L1 on 
L2, L2 on L3. Grade 2 anterolisthesis of L4 on L5. 
VERTEBRAL BODY HEIGHT: Chronic moderate compression fracture of T12 and L1 
superior endplates, posterior buckling of superior endplates.  
MARROW SIGNAL: Sacral Tarlov cyst towards right side. 
CORD SIGNAL: Normal distal spinal cord and cauda equina.  Conus terminates at 
L1-L2. 
ADDITIONAL FINDINGS: None. 
Modic I-II: Modic change at L1-L2 and L2-L3. 
Ligamentum Flavum > 2.5 mm: All levels. 
-------------------------------------------------------------------------------- 
------ 
SEGMENTAL: 
T11-T12: Posterior buckling of superior endplate of T12 results in mild 
deformity of ventral cord and mild central canal narrowing. There is bilateral 
facet hypertrophy. No significant neural foraminal narrowing. 
T12-L1: Disc bulge, bilateral facet hypertrophy; mild central canal narrowing.  
Mild right neural foraminal narrowing. No significant left neural foraminal 
narrowing.  
L1-L2: Loss of disc height with uncovering the disc space and disc bulge 
eccentric to the left, bilateral facet/ligamentum flavum hypertrophy; mild 
central canal narrowing.  Mild right neural foraminal narrowing. No significant 
left neural foraminal narrowing.  
L2-L3: Loss of disc height with disc bulge, bilateral facet/ligamentum flavum 
hypertrophy; mild central canal narrowing with right subarticular recess 
narrowing.  Mild right neural foraminal narrowing. No significant left neural 
foraminal narrowing.  
L3-L4: Loss of disc height posteriorly with disc bulge, bilateral 
facet/ligamentum flavum hypertrophy; mild central canal narrowing.  No 
significant right neural foraminal narrowing. No significant left neural 
foraminal narrowing.  
L4-L5: Uncovering of the disc space, central to left subarticular disc 
herniation extending superiorly, bilateral facet/ligamentum flavum hypertrophy; 
moderate to severe central canal narrowing with severe bilateral subarticular 
recess narrowing.  Mild right neural foraminal narrowing. Mild left neural 
foraminal narrowing.  
L5-S1: Bilateral facet hypertrophy; no significant central canal narrowing, 
moderate left subarticular recess narrowing.  No significant right neural 
foraminal narrowing. No significant left neural foraminal narrowing.  
-------------------------------------------------------------------------------- 
------
IMPRESSION: 1.  Discogenic/degenerative changes as above. 
2.  Severe bilateral subarticular recess narrowing at L4-L5, please correlate 
for traversing bilateral L5 radiculopathy. 
3.  Chronic compression fractures of T12 and L1.
